# Patient Record
Sex: Male | Born: 1950 | Race: White | Hispanic: No | Marital: Married | Smoking: Never smoker
Health system: Southern US, Community
[De-identification: ages and names within clinical notes are randomized; demographics above are authoritative.]

## PROBLEM LIST (undated history)

## (undated) DIAGNOSIS — I10 Essential (primary) hypertension: Secondary | ICD-10-CM

## (undated) DIAGNOSIS — R7303 Prediabetes: Secondary | ICD-10-CM

## (undated) DIAGNOSIS — E785 Hyperlipidemia, unspecified: Secondary | ICD-10-CM

## (undated) HISTORY — PX: KNEE SURGERY: SHX244

## (undated) HISTORY — PX: ACHILLES TENDON SURGERY: SHX542

## (undated) HISTORY — DX: Essential (primary) hypertension: I10

## (undated) HISTORY — DX: Prediabetes: R73.03

## (undated) HISTORY — DX: Hyperlipidemia, unspecified: E78.5

## (undated) HISTORY — PX: ELBOW SURGERY: SHX618

---

## 1999-01-20 ENCOUNTER — Ambulatory Visit (HOSPITAL_BASED_OUTPATIENT_CLINIC_OR_DEPARTMENT_OTHER): Admission: RE | Admit: 1999-01-20 | Discharge: 1999-01-20 | Payer: Self-pay | Admitting: Orthopedic Surgery

## 2008-12-25 ENCOUNTER — Encounter: Admission: RE | Admit: 2008-12-25 | Discharge: 2008-12-25 | Payer: Self-pay | Admitting: Internal Medicine

## 2010-02-22 IMAGING — CR DG CHEST 2V
3 series · 3 of 3 positions shown · non-contrast
Comparison: None

CLINICAL DATA: Recurrent left lower lobe pneumonia, follow-up

CHEST - 2 VIEW

[view not recorded (1 of 3)]
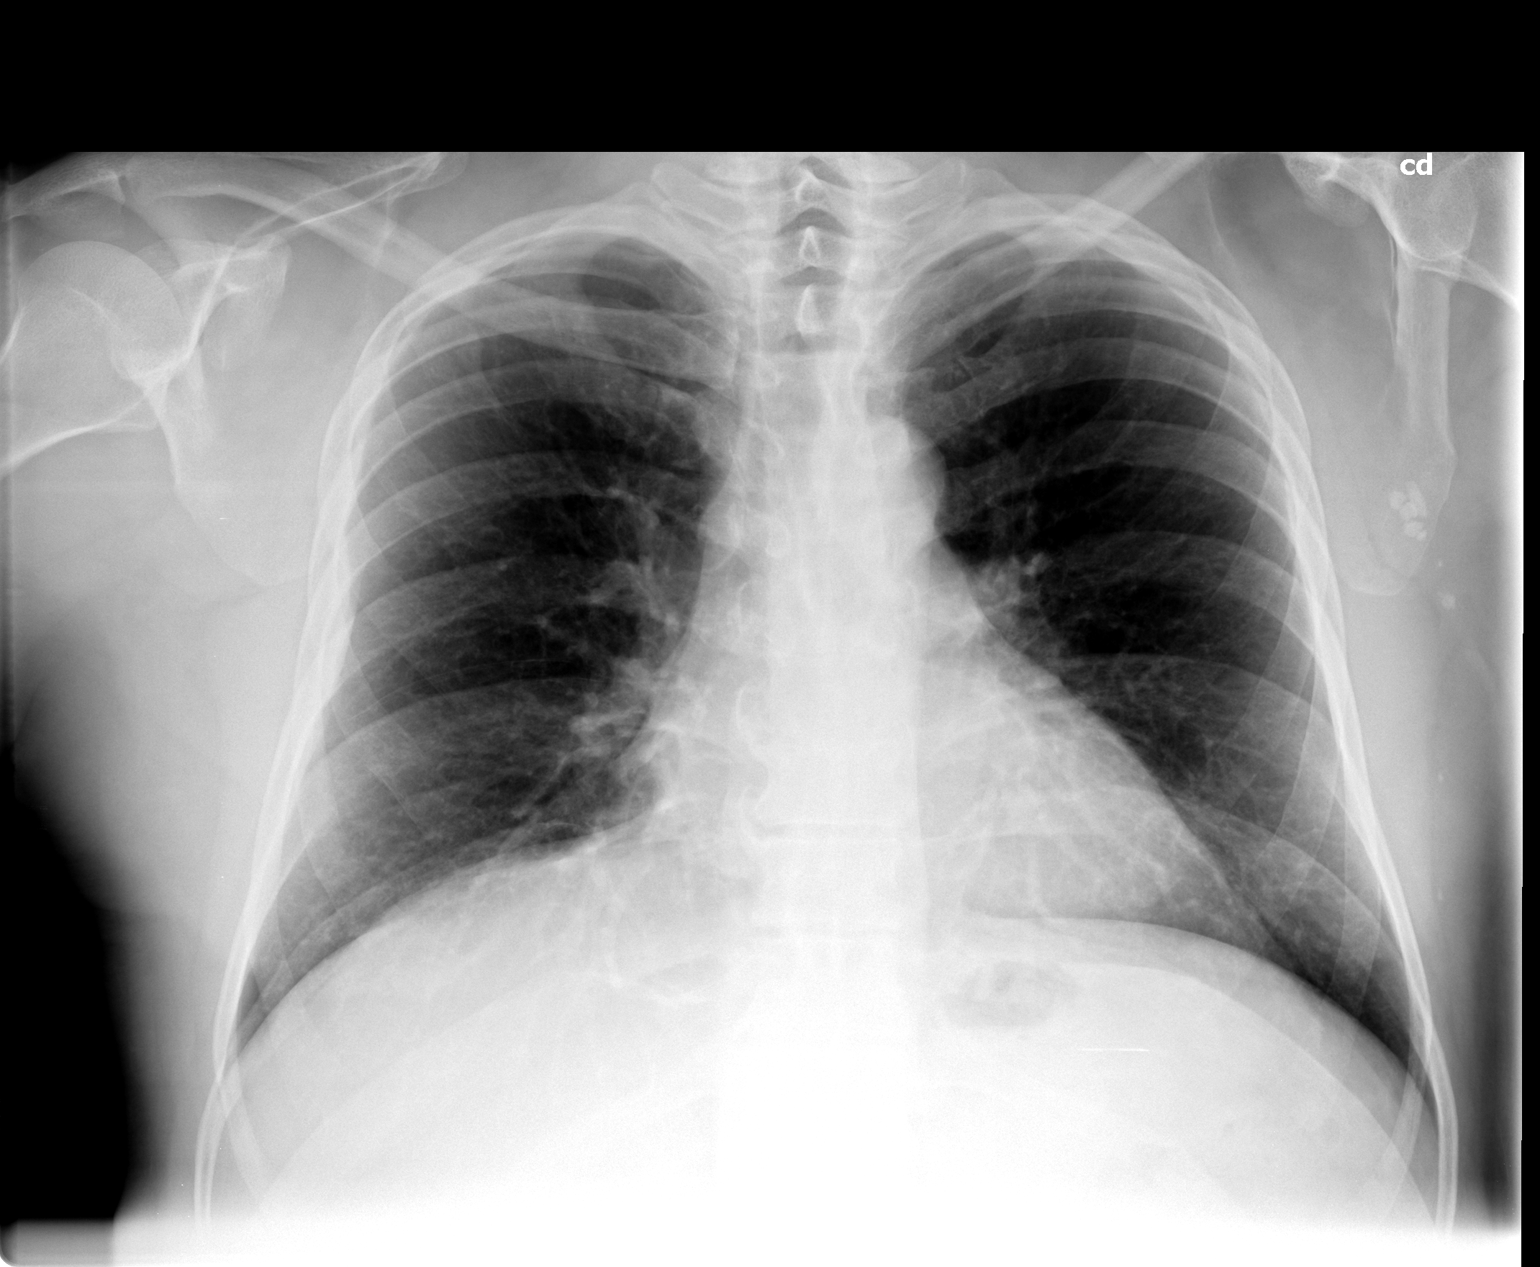

[view not recorded (2 of 3)]
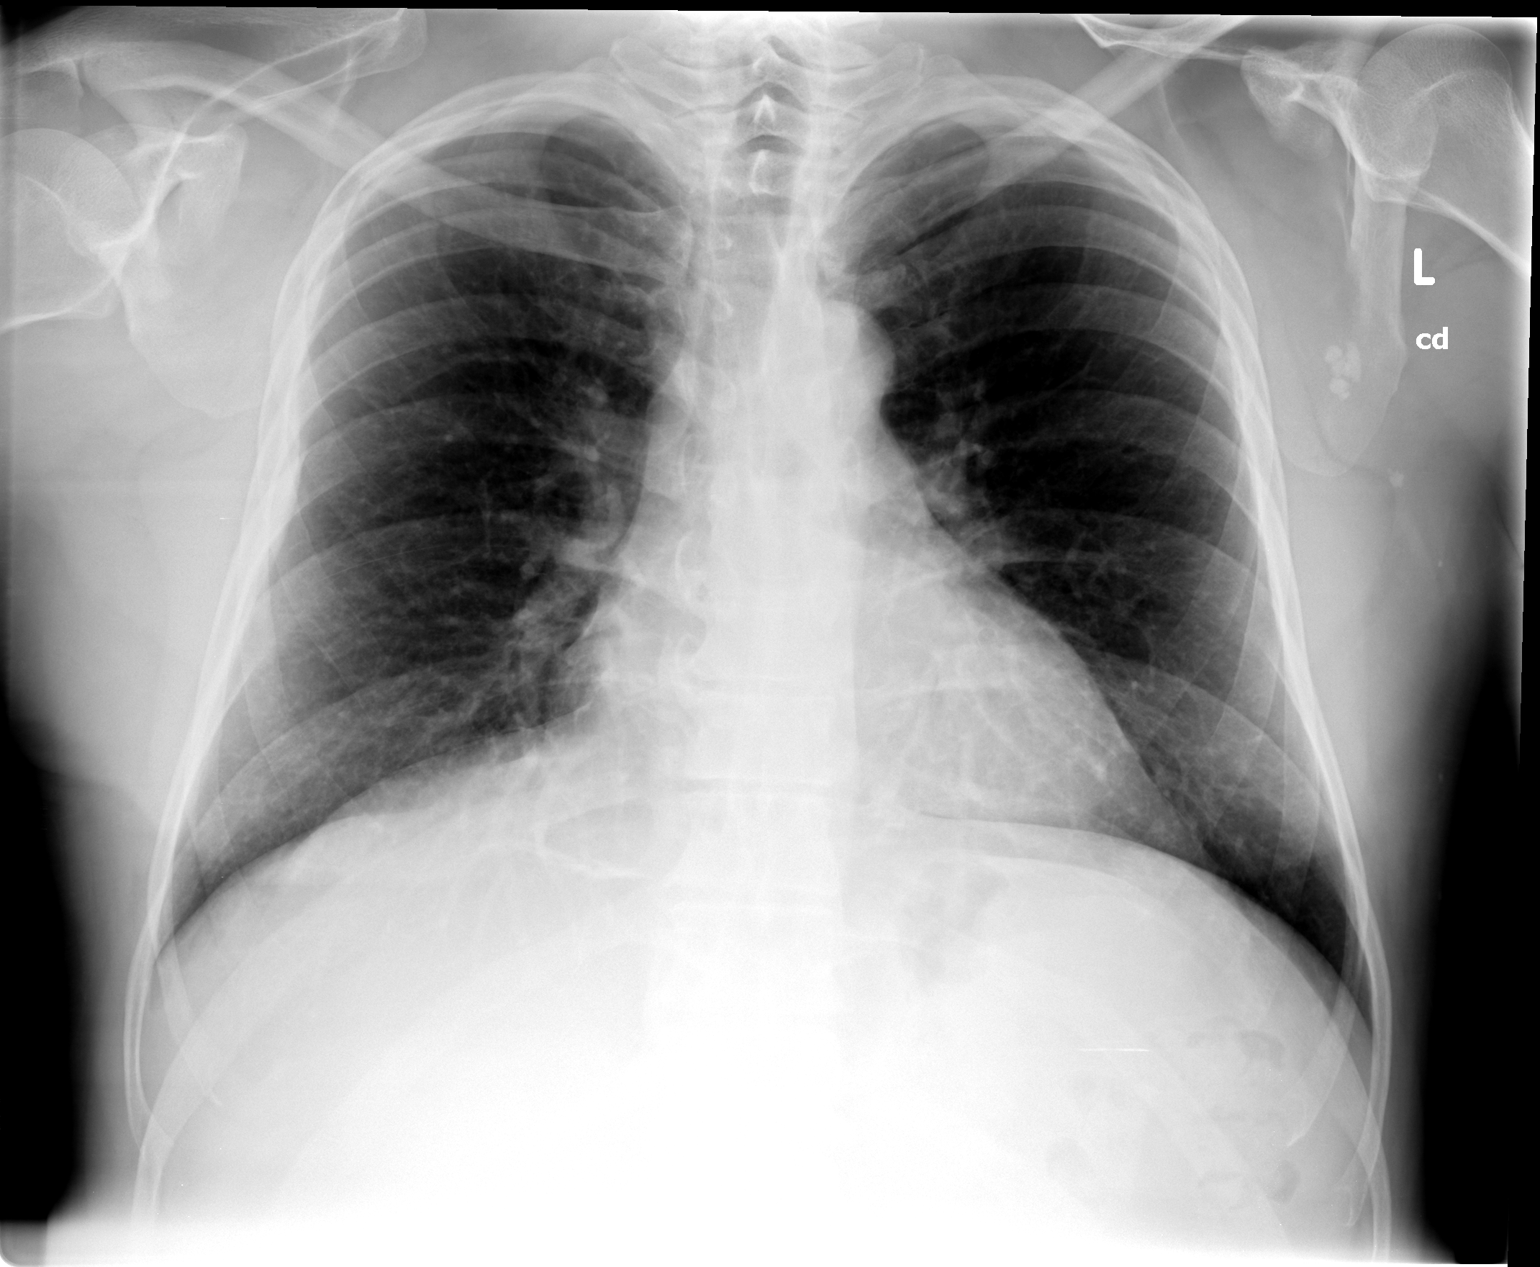

[view not recorded (3 of 3)]
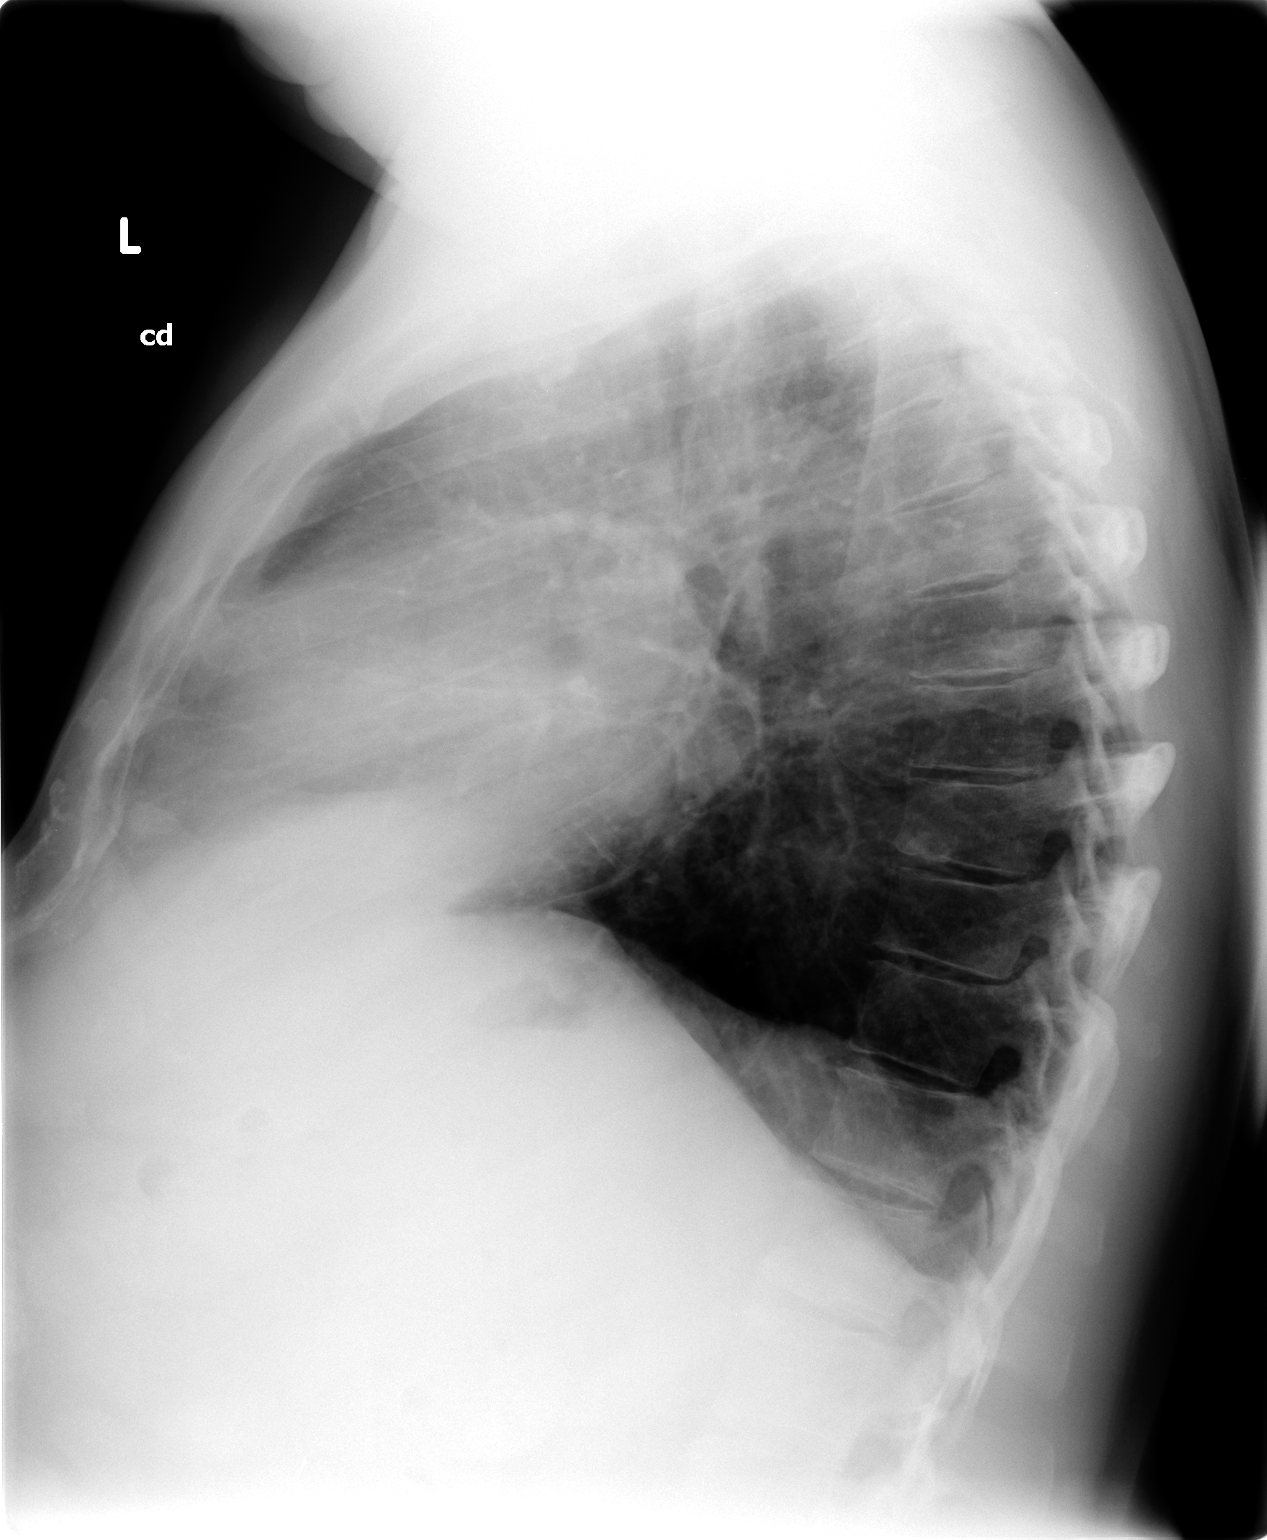

[3 of 3 positions shown; findings below may reference images not displayed]

FINDINGS: No active infiltrate or effusion is seen.  The heart is
within upper limits normal.  Opacities overlie the left axilla
which may represent foreign bodies or possibly small calcified
nodes.  No bony abnormality is seen.
IMPRESSION: No active lung disease.  Densities overlying the left axilla may
represent foreign bodies or small calcified nodes.

## 2016-02-23 ENCOUNTER — Other Ambulatory Visit: Payer: Self-pay | Admitting: Internal Medicine

## 2016-02-23 DIAGNOSIS — R252 Cramp and spasm: Secondary | ICD-10-CM

## 2016-02-23 DIAGNOSIS — S8011XA Contusion of right lower leg, initial encounter: Secondary | ICD-10-CM

## 2016-02-23 DIAGNOSIS — R601 Generalized edema: Secondary | ICD-10-CM

## 2016-02-24 ENCOUNTER — Ambulatory Visit
Admission: RE | Admit: 2016-02-24 | Discharge: 2016-02-24 | Disposition: A | Discharge status: Home / Self Care | Payer: BC Managed Care – PPO | Source: Ambulatory Visit | Attending: Internal Medicine | Admitting: Internal Medicine

## 2016-02-24 DIAGNOSIS — R601 Generalized edema: Secondary | ICD-10-CM

## 2016-02-24 DIAGNOSIS — R252 Cramp and spasm: Secondary | ICD-10-CM

## 2016-02-24 DIAGNOSIS — S8011XA Contusion of right lower leg, initial encounter: Secondary | ICD-10-CM

## 2022-03-31 ENCOUNTER — Ambulatory Visit: Payer: Medicare PPO | Admitting: Cardiology

## 2022-04-10 ENCOUNTER — Ambulatory Visit: Payer: Medicare PPO | Admitting: Cardiology

## 2022-05-01 NOTE — Progress Notes (Unsigned)
ID:  Brendan Walker, DOB Nov 07, 1950, MRN [id number]  PCP:  No primary care provider on file.  Cardiologist:  Roslyn Smiling, DO, [place] (established care 05/02/2022) Former Cardiology Providers: ***  REASON FOR CONSULT: ***  REQUESTING PHYSICIAN:  Jilda Panda, MD [id number],  [place] [address]  No chief complaint on file.   HPI  Brendan Walker is a 71 y.o. *** male who presents to the clinic for evaluation of *** at the request of Jilda Panda, MD. His past medical history and cardiovascular risk factors include: ***  ***  History of  Denies prior history of coronary artery disease, myocardial infarction, congestive heart failure, deep venous thrombosis, pulmonary embolism, stroke, transient ischemic attack.  FUNCTIONAL STATUS: ***   ALLERGIES: Not on File  MEDICATION LIST PRIOR TO VISIT: No outpatient medications have been marked as taking for the 05/02/22 encounter (Appointment) with Rex Kras, DO.     PAST MEDICAL HISTORY: No past medical history on file.  PAST SURGICAL HISTORY: *** The histories are not reviewed yet. Please review them in the "History" navigator section and refresh this [place].  FAMILY HISTORY: The patient family history is not on file.  SOCIAL HISTORY:  The patient    REVIEW OF SYSTEMS: ROS  PHYSICAL EXAM:     No data to display          CONSTITUTIONAL: Well-developed and well-nourished. No acute distress.  SKIN: Skin is warm and dry. No rash noted. No cyanosis. No pallor. No jaundice HEAD: Normocephalic and atraumatic.  EYES: No scleral icterus MOUTH/THROAT: Moist oral membranes.  NECK: No JVD present. No thyromegaly noted. No carotid bruits  LYMPHATIC: No visible cervical adenopathy.  CHEST Normal respiratory effort. No intercostal retractions  LUNGS: ***Clear to auscultation bilaterally.  No stridor. No wheezes. No rales.  CARDIOVASCULAR: ***Regular rate and rhythm, positive S1-S2, no murmurs rubs or  gallops appreciated. ABDOMINAL: *** No apparent ascites.  EXTREMITIES: No peripheral edema, warm to touch, ***DP and PT pulses HEMATOLOGIC: No significant bruising NEUROLOGIC: Oriented to person, place, and time. Nonfocal. Normal muscle tone.  PSYCHIATRIC: Normal mood and affect. Normal behavior. Cooperative  CARDIAC DATABASE: EKG: ***  Echocardiogram: No results found for this or any previous visit from the past 1095 days.    Stress Testing: No results found for this or any previous visit from the past 1095 days.   Heart Catheterization: None  LABORATORY DATA:     No data to display              No data to display          Lipid Panel  No results found for: "CHOL", "TRIG", "HDL", "CHOLHDL", "VLDL", "LDLCALC", "LDLDIRECT", "LABVLDL"  No components found for: "NTPROBNP" No results for input(s): "PROBNP" in the last 8760 hours. No results for input(s): "TSH" in the last 8760 hours.  BMP No results for input(s): "NA", "K", "CL", "CO2", "GLUCOSE", "BUN", "CREATININE", "CALCIUM", "GFRNONAA", "GFRAA" in the last 8760 hours.  HEMOGLOBIN A1C No results found for: "HGBA1C", "MPG"  External Labs:  Date Collected: 03/17/2022 , information obtained by *** Potassium: 4.2 Creatinine 1.03 mg/dL. eGFR: 79 mL/min per 1.73 m Hemoglobin: *** g/dL and hematocrit: *** % Lipid profile: Total cholesterol 175 , triglycerides 249 , HDL 39 , LDL 100 AST: *** , ALT: *** , alkaline phosphatase: ***  Hemoglobin A1c: *** TSH: ***    IMPRESSION:  No diagnosis found.   RECOMMENDATIONS: Brendan Walker is a 71 y.o. *** male whose  past medical history and cardiac risk factors include: ***   FINAL MEDICATION LIST END OF ENCOUNTER: No orders of the defined types were placed in this encounter.   There are no discontinued medications.  No current outpatient medications on file.  No orders of the defined types were placed in this encounter.   There are no Patient Instructions  on file for this visit.   --Continue cardiac medications as reconciled in final medication list. --No follow-ups on file. or sooner if needed. --Continue follow-up with your primary care physician regarding the management of your other chronic comorbid conditions.  Patient's questions and concerns were addressed to his satisfaction. He voices understanding of the instructions provided during this encounter.   This note was created using a voice recognition software as a result there may be grammatical errors inadvertently enclosed that do not reflect the nature of this encounter. Every attempt is made to correct such errors.  Rex Kras, [place], [facility]  Pager: [phone] Office: [phone]

## 2022-05-02 ENCOUNTER — Encounter: Payer: Self-pay | Admitting: Cardiology

## 2022-05-02 ENCOUNTER — Ambulatory Visit: Payer: Medicare PPO | Admitting: Cardiology

## 2022-05-02 VITALS — BP 132/75 | HR 79 | Temp 98.1°F | Resp 16 | Ht 67.0 in | Wt 194.0 lb

## 2022-05-02 DIAGNOSIS — Z87891 Personal history of nicotine dependence: Secondary | ICD-10-CM

## 2022-05-02 DIAGNOSIS — R7303 Prediabetes: Secondary | ICD-10-CM

## 2022-05-02 DIAGNOSIS — R072 Precordial pain: Secondary | ICD-10-CM

## 2022-05-02 DIAGNOSIS — E781 Pure hyperglyceridemia: Secondary | ICD-10-CM

## 2022-05-02 DIAGNOSIS — I10 Essential (primary) hypertension: Secondary | ICD-10-CM

## 2022-05-12 ENCOUNTER — Other Ambulatory Visit: Payer: BC Managed Care – PPO

## 2022-06-09 ENCOUNTER — Ambulatory Visit
Admission: RE | Admit: 2022-06-09 | Discharge: 2022-06-09 | Disposition: A | Discharge status: Home / Self Care | Payer: No Typology Code available for payment source | Source: Ambulatory Visit | Attending: Cardiology | Admitting: Cardiology

## 2022-06-09 DIAGNOSIS — R072 Precordial pain: Secondary | ICD-10-CM

## 2022-06-16 ENCOUNTER — Ambulatory Visit: Payer: Medicare PPO

## 2022-06-16 DIAGNOSIS — R072 Precordial pain: Secondary | ICD-10-CM

## 2022-06-23 ENCOUNTER — Ambulatory Visit: Payer: Medicare PPO | Admitting: Cardiology

## 2022-06-23 NOTE — Progress Notes (Signed)
Tried calling patient no answer left a vm

## 2022-06-23 NOTE — Progress Notes (Signed)
Called and spoke to patient he voiced understanding and copy has been sent to patient's PCP

## 2022-06-23 NOTE — Progress Notes (Signed)
Called and spoke to patient he voiced understanding

## 2022-07-04 ENCOUNTER — Ambulatory Visit: Payer: Medicare PPO | Admitting: Cardiology

## 2022-07-04 ENCOUNTER — Encounter: Payer: Self-pay | Admitting: Cardiology

## 2022-07-04 VITALS — BP 133/70 | HR 77 | Temp 98.1°F | Resp 16 | Ht 67.0 in | Wt 190.2 lb

## 2022-07-04 DIAGNOSIS — E781 Pure hyperglyceridemia: Secondary | ICD-10-CM

## 2022-07-04 DIAGNOSIS — I10 Essential (primary) hypertension: Secondary | ICD-10-CM

## 2022-07-04 DIAGNOSIS — R911 Solitary pulmonary nodule: Secondary | ICD-10-CM

## 2022-07-04 DIAGNOSIS — Z87891 Personal history of nicotine dependence: Secondary | ICD-10-CM

## 2022-07-04 DIAGNOSIS — R072 Precordial pain: Secondary | ICD-10-CM

## 2022-07-04 NOTE — Progress Notes (Signed)
ID:  Brendan Walker, DOB [birth year], MRN [id number]  PCP:  Jilda Panda, MD  Cardiologist:  Rex Kras, DO, [facility] (established care 05/02/2022)  Date: 07/04/22 Last Office Visit: 05/02/2022  Chief Complaint  Patient presents with   Results   Chest Pain   Follow-up    HPI  Brendan Walker is a 71 y.o. Caucasian male whose past medical history and cardiovascular risk factors include: Dyslipidemia, GERD, hypertension, prediabetes, obesity, history of COVID-19 infection, pulmonary nodule (Incidental finding on coronary calcium report), former smoker.   Patient was referred to the practice for evaluation of chest pain.  Based on his symptoms likely noncardiac but given his risk factors and advanced age this shared decision was to proceed with further work-up.  Echocardiogram notes stable LVEF additional details noted below for further reference.  Exercise treadmill stress test reported to be low risk.  And his total coronary calcium score 0.42 placing her at the 16th percentile.  Incidentally patient was also noted to have multiple small basilar pulmonary nodules largest measuring 5 mm.  He does have a history of COVID-19 as well as smoking and therefore I have asked him to discuss the findings further with PCP to see if additional work-up is warranted.  Patient has an upcoming appointment with his PCP in November.  Currently denies anginal discomfort or heart failure symptoms.  He goes to [employer] 3 times a week and walks on a treadmill for 30 minutes and has not noticed any change in overall physical endurance or stamina.   FUNCTIONAL STATUS: Goes to [employer] 3 times a week and walks at least 30 minutes a day.  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  ALLERGIES: No Known Allergies  MEDICATION LIST PRIOR TO VISIT: Current Meds  Medication Sig   Apoaequorin (PREVAGEN) 10 MG CAPS Take 1 capsule by mouth daily at 12 noon.   loratadine (CLARITIN) 10 MG  tablet Take 10 mg by mouth daily.   losartan-hydrochlorothiazide (HYZAAR) 100-12.5 MG tablet Take 1 tablet by mouth daily.   pantoprazole (PROTONIX) 40 MG tablet Take 1 tablet by mouth daily at 12 noon.     PAST MEDICAL HISTORY: Past Medical History:  Diagnosis Date   Dyslipidemia    Hypertension    Prediabetes     PAST SURGICAL HISTORY: Past Surgical History:  Procedure Laterality Date   ACHILLES TENDON SURGERY Right    ELBOW SURGERY Right    KNEE SURGERY Right     FAMILY HISTORY: The patient family history includes Diabetes in his brother; Heart attack in his mother; Leukemia in his father.  SOCIAL HISTORY:  The patient  reports that he has never smoked. He has never used smokeless tobacco. He reports current alcohol use of about 1.0 - 2.0 standard drink of alcohol per week. He reports that he does not use drugs.  REVIEW OF SYSTEMS: Review of Systems  Cardiovascular:  Negative for chest pain, claudication, dyspnea on exertion, irregular heartbeat, leg swelling, near-syncope, orthopnea, palpitations, paroxysmal nocturnal dyspnea and syncope.  Respiratory:  Negative for shortness of breath.   Hematologic/Lymphatic: Negative for bleeding problem.  Musculoskeletal:  Negative for muscle cramps and myalgias.  Neurological:  Negative for dizziness and light-headedness.    PHYSICAL EXAM:    07/04/2022   11:51 AM 05/02/2022    2:51 PM 05/02/2022    2:50 PM  Vitals with BMI  Height 5' 7"  5' 7"  Weight 190 lbs 3 oz  194 lbs  BMI 84.53  64.68  Systolic 032 122 482  Diastolic 70 75 80  Pulse 77 79 78   Physical Exam  Constitutional: He appears healthy. No distress.  Neck: No JVD present.  Cardiovascular: Normal rate, regular rhythm, S1 normal, S2 normal, intact distal pulses and normal pulses. Exam reveals no gallop, no S3 and no S4.  No murmur heard. Pulmonary/Chest: Effort normal and breath sounds normal. No stridor. He has no wheezes. He has no rales.  Abdominal: Soft.  Bowel sounds are normal. He exhibits no distension. There is no abdominal tenderness.  Musculoskeletal:        General: No edema.     Cervical back: Neck supple.  Neurological: He is alert and oriented to person, place, and time.  Skin: Skin is warm and moist.   CARDIAC DATABASE: EKG: 05/02/2022: Normal sinus rhythm, 73 bpm, without underlying ischemia injury pattern.  Echocardiogram: 06/16/2022: Normal LV systolic function with visual EF 60-65%. Left ventricle cavity is normal in size. Normal left ventricular wall thickness. Normal global wall motion. Doppler evidence of grade I (impaired) diastolic dysfunction, normal LAP. Left atrial cavity is mildly dilated at 39.4 ml/m^2 (personally reviewed). Structurally normal tricuspid valve with trace regurgitation. No evidence of pulmonary hypertension. No prior available for comparison  Stress Testing: Exercise treadmill stress test 06/16/2022: Exercise treadmill stress test performed using Bruce protocol. Patient reached 7.1 METS, and 86% of age predicted maximum heart rate. Exercise capacity was low. No chest pain reported. Normal heart rate and hemodynamic response. Stress EKG revealed no ischemic changes. Low risk study.   Heart Catheterization: None  Coronary calcium report 06/12/2022 1. Coronary artery calcium score of 0.429. This places the patient in the 16th percentile for subjects of the same age, gender and race/ethnicity who are free of clinical cardiovascular disease and treated diabetes.  2. Multiple small basilar pulmonary nodules, the largest measuring 15m. No follow-up needed if patient is low-risk (and has no known or suspected primary neoplasm). Non-contrast chest CT can be considered in 12 months if patient is high-risk. This recommendation follows the consensus statement: Guidelines for Management of Incidental Pulmonary Nodules Detected on CT Images: From the Fleischner Society 2017; Radiology 2017; 284:228-243. This  recommendation does not apply if the patient has a history of neoplasm where these nodules would be more likely to represent a neoplastic process. 3. Aortic atherosclerosis.  LABORATORY DATA: External Labs:  Date Collected: 03/17/2022 , information obtained by referring physician Potassium: 4.2 Creatinine 1.03 mg/dL. eGFR: 79 mL/min per 1.73 m Lipid profile: Total cholesterol 175 , triglycerides 249 , HDL 39 , LDL 100, non-HDL 136   IMPRESSION:    ICD-10-CM   1. Precordial pain  R07.2     2. Hypertriglyceridemia  E78.1     3. Benign hypertension  I10     4. Former smoker  Z87.891     5. Pulmonary nodule  R91.1        RECOMMENDATIONS: KKACEE KORENis a 71y.o. Caucasian male whose past medical history and cardiac risk factors include: Dyslipidemia, GERD, hypertension, prediabetes, obesity, history of COVID-19 infection, pulmonary nodule (Incidental finding on coronary calcium report), former smoker.   Precordial pain Resolved since last office visit. EKG: Nonischemic. Echo: Preserved LVEF, grade 1 diastolic impairment, no significant valvular heart disease. GXT: Low risk study. CAC: Total CAC 0.429, 16th percentile No additional cardiovascular work-up warranted at this time as he is asymptomatic and overall the work-up was favorable. Encouraged him to continue  his physical activity and focus on improving his modifiable cardiovascular risk factors such as lipids and triglyceride levels.  If the numbers continue to be elevated despite lifestyle changes low-dose medical therapy could be considered.  We will defer management to PCP for now.  Hypertriglyceridemia Last lipid profile from June 2023 reviewed. Patient currently focusing on reducing dietary intake of high triglyceride rich foods. Plans to have a repeat lipid profile with PCP in November 2023.  If his numbers continue to be not well controlled may consider pharmacological therapy.  Benign hypertension Office  blood pressures are well controlled. No changes warranted at this time. Medications reconciled.  Former smoker Reemphasized importance of complete smoking cessation.  Pulmonary nodule Incidentally noted on coronary calcium report. History of COVID-19 infection and former smoker. I have asked him to discuss the need for additional work-up and a pulmonary consultation with PCP. Patient verbalizes understanding and will discuss with his PCP in November 2023. We will send a copy of the report to PCP as well to help coordinate care.  Shared decision was to follow-up on an annual basis after his yearly well visit.  FINAL MEDICATION LIST END OF ENCOUNTER: No orders of the defined types were placed in this encounter.   There are no discontinued medications.   Current Outpatient Medications:    Apoaequorin (PREVAGEN) 10 MG CAPS, Take 1 capsule by mouth daily at 12 noon., Disp: , Rfl:    loratadine (CLARITIN) 10 MG tablet, Take 10 mg by mouth daily., Disp: , Rfl:    losartan-hydrochlorothiazide (HYZAAR) 100-12.5 MG tablet, Take 1 tablet by mouth daily., Disp: , Rfl:    pantoprazole (PROTONIX) 40 MG tablet, Take 1 tablet by mouth daily at 12 noon., Disp: , Rfl:   No orders of the defined types were placed in this encounter.   There are no Patient Instructions on file for this visit.   --Continue cardiac medications as reconciled in final medication list. --Return in about 15 months (around 10/08/2023) for Annual follow up visit. or sooner if needed. --Continue follow-up with your primary care physician regarding the management of your other chronic comorbid conditions.  Patient's questions and concerns were addressed to his satisfaction. He voices understanding of the instructions provided during this encounter.   This note was created using a voice recognition software as a result there may be grammatical errors inadvertently enclosed that do not reflect the nature of this encounter. Every  attempt is made to correct such errors.  Rex Kras, [place], [facility]  Pager: [phone] Office: [phone]

## 2023-06-14 NOTE — Telephone Encounter (Signed)
This encounter was created in error - please disregard.

## 2023-10-08 ENCOUNTER — Ambulatory Visit: Payer: Self-pay | Admitting: Cardiology

## 2024-08-14 ENCOUNTER — Other Ambulatory Visit: Payer: Self-pay | Admitting: Internal Medicine

## 2024-08-14 DIAGNOSIS — R911 Solitary pulmonary nodule: Secondary | ICD-10-CM

## 2024-08-20 ENCOUNTER — Encounter: Payer: Self-pay | Admitting: Internal Medicine

## 2024-08-25 ENCOUNTER — Other Ambulatory Visit

## 2024-10-16 ENCOUNTER — Other Ambulatory Visit
